# Patient Record
Sex: Female | Born: 1965 | Race: Black or African American | Hispanic: No | Marital: Single | Smoking: Current every day smoker
Health system: Southern US, Community
[De-identification: ages and names within clinical notes are randomized; demographics above are authoritative.]

## PROBLEM LIST (undated history)

## (undated) DIAGNOSIS — E119 Type 2 diabetes mellitus without complications: Secondary | ICD-10-CM

## (undated) HISTORY — PX: ABDOMINAL HYSTERECTOMY: SHX81

---

## 2012-05-06 ENCOUNTER — Encounter (HOSPITAL_BASED_OUTPATIENT_CLINIC_OR_DEPARTMENT_OTHER): Payer: Self-pay | Admitting: *Deleted

## 2012-05-06 ENCOUNTER — Emergency Department (HOSPITAL_BASED_OUTPATIENT_CLINIC_OR_DEPARTMENT_OTHER)
Admission: EM | Admit: 2012-05-06 | Discharge: 2012-05-06 | Disposition: A | Discharge status: Home / Self Care | Payer: Medicaid Other | Attending: Emergency Medicine | Admitting: Emergency Medicine

## 2012-05-06 DIAGNOSIS — F17200 Nicotine dependence, unspecified, uncomplicated: Secondary | ICD-10-CM | POA: Insufficient documentation

## 2012-05-06 DIAGNOSIS — T819XXA Unspecified complication of procedure, initial encounter: Secondary | ICD-10-CM

## 2012-05-06 DIAGNOSIS — IMO0001 Reserved for inherently not codable concepts without codable children: Secondary | ICD-10-CM | POA: Insufficient documentation

## 2012-05-06 DIAGNOSIS — T8131XA Disruption of external operation (surgical) wound, not elsewhere classified, initial encounter: Secondary | ICD-10-CM | POA: Insufficient documentation

## 2012-05-06 DIAGNOSIS — T8189XA Other complications of procedures, not elsewhere classified, initial encounter: Secondary | ICD-10-CM | POA: Insufficient documentation

## 2012-05-06 DIAGNOSIS — Y838 Other surgical procedures as the cause of abnormal reaction of the patient, or of later complication, without mention of misadventure at the time of the procedure: Secondary | ICD-10-CM | POA: Insufficient documentation

## 2012-05-06 HISTORY — DX: Type 2 diabetes mellitus without complications: E11.9

## 2012-05-06 LAB — CBC WITH DIFFERENTIAL/PLATELET
Basophils Absolute: 0 10*3/uL (ref 0.0–0.1)
Basophils Relative: 0 % (ref 0–1)
Eosinophils Absolute: 0 10*3/uL (ref 0.0–0.7)
Eosinophils Relative: 1 % (ref 0–5)
HCT: 33.6 % — ABNORMAL LOW (ref 36.0–46.0)
Hemoglobin: 11.8 g/dL — ABNORMAL LOW (ref 12.0–15.0)
Lymphocytes Relative: 38 % (ref 12–46)
Lymphs Abs: 2.9 10*3/uL (ref 0.7–4.0)
MCH: 27.9 pg (ref 26.0–34.0)
MCHC: 35.1 g/dL (ref 30.0–36.0)
MCV: 79.4 fL (ref 78.0–100.0)
Monocytes Absolute: 0.6 10*3/uL (ref 0.1–1.0)
Monocytes Relative: 7 % (ref 3–12)
Neutro Abs: 4.3 10*3/uL (ref 1.7–7.7)
Neutrophils Relative %: 55 % (ref 43–77)
Platelets: 327 10*3/uL (ref 150–400)
RBC: 4.23 MIL/uL (ref 3.87–5.11)
RDW: 12.7 % (ref 11.5–15.5)
WBC: 7.8 10*3/uL (ref 4.0–10.5)

## 2012-05-06 LAB — BASIC METABOLIC PANEL WITH GFR
BUN: 7 mg/dL (ref 6–23)
CO2: 22 meq/L (ref 19–32)
Calcium: 9.6 mg/dL (ref 8.4–10.5)
Chloride: 99 meq/L (ref 96–112)
Creatinine, Ser: 0.4 mg/dL — ABNORMAL LOW (ref 0.50–1.10)
GFR calc Af Amer: >90 mL/min
GFR calc non Af Amer: >90 mL/min
Glucose, Bld: 243 mg/dL — ABNORMAL HIGH (ref 70–99)
Potassium: 3.6 meq/L (ref 3.5–5.1)
Sodium: 134 meq/L — ABNORMAL LOW (ref 135–145)

## 2012-05-06 MED ORDER — OXYCODONE-ACETAMINOPHEN 5-325 MG PO TABS: 2.0000 | ORAL_TABLET | ORAL | Status: DC | PRN

## 2012-05-06 NOTE — ED Provider Notes (Signed)
History     CSN: [id number]  Arrival date & time 05/06/12  1246   First MD Initiated Contact with Patient 05/06/12 1421      Chief Complaint  Patient presents with  . Wound Infection    (Consider location/radiation/quality/duration/timing/severity/associated sxs/prior treatment) Patient is a 46 y.o. female presenting with hand pain. The history is provided by the patient. No language interpreter was used.  Hand Pain This is a recurrent problem. Episode onset: 10 days. The problem occurs constantly. The problem has been gradually worsening. Pertinent negatives include no chills. Treatments tried: antibiotics. The treatment provided no relief.  Pt complains of pain to her left thumb.   Pt reports she has had 2 I and D's.  Pt upset because she thinks the first was not done right.,  Pt is on antibiotics.  (Pt does not know what medications she is on)  Pt reports she is schedule to be seen at [facility] for a recheck on Thursday.  I obtained records from [facility].  Pt had a basic I and D to abscessed area mid thumb on 11/8.  Pt had worsening swelling and was seen by surgeon who felt area had progressed to a felon and did a lateral incision and opened to medial incision.  Pt presents here today because she reports pain is worseand her concern that something was done wrong.   Pt is diabetic,  Pt's records indicate poorly controlled diabetes.    Past Medical History  Diagnosis Date  . Diabetes mellitus without complication     Past Surgical History  Procedure Date  . Cesarean section   . Abdominal hysterectomy     History reviewed. No pertinent family history.  History  Substance Use Topics  . Smoking status: Current Every Day Smoker -- 0.5 packs/day    Types: Cigarettes  . Smokeless tobacco: Not on file  . Alcohol Use: No    OB History    Grav Para Term Preterm Abortions TAB SAB Ect Mult Living                  Review of Systems  Constitutional: Negative for chills.    All other systems reviewed and are negative.    Allergies  Review of patient's allergies indicates no known allergies.  Home Medications  No current outpatient prescriptions on file.  BP 137/99  Pulse 100  Temp 99.6 F (37.6 C) (Oral)  Ht 4\' 11"  (1.499 m)  Wt 130 lb (58.968 kg)  BMI 26.26 kg/m2  SpO2 100%  Physical Exam  Nursing note and vitals reviewed. Constitutional: She is oriented to person, place, and time. She appears well-developed and well-nourished.  Musculoskeletal: She exhibits edema and tenderness.       Discolored left thumb,  Open incisions,  No drainage,  No erythema  Neurological: She is alert and oriented to person, place, and time. She has normal reflexes.  Psychiatric: She has a normal mood and affect.    ED Course  Procedures (including critical care time)   Labs Reviewed  CBC WITH DIFFERENTIAL  BASIC METABOLIC PANEL   No results found.   No diagnosis found.    MDM  Dr. Ethelda Chick in to see and examine.   I spoke to [facility] office.  They will see the pt in the office tomorrow.  I advised pt her care appears appropriate. I suspect Pt is healing poorly due to uncontrolled diabetes.   She needs to continue her antibiotic (Bactrim by  her records)   Pt is to go to [facility] tomorrow at 1:40pm.   Pt is counseled on controlling diabetes.          Brianna Lin, [place] 05/06/12 [phone]

## 2012-05-06 NOTE — ED Notes (Signed)
[facility] st[facility]" and does not know meds but antibiotics. Requesting recent medical records from [facility] ED.

## 2012-05-06 NOTE — ED Provider Notes (Addendum)
Complain of left swollen painful thumb since 04/26/2012 patient recently had felon incised and drained at [facility]. Has follow up at the hand specialist office scheduled for 05/09/2012 on exam patient is alert appears uncomfortable left thumb swollen tender diffusely. Limited flexion due to pain and swelling. Followup appointment with hand surgeon has been rescheduled for tomorrow.  Doug Sou, MD 05/06/12 1637  Doug Sou, MD 05/06/12 [phone]

## 2012-05-06 NOTE — ED Notes (Addendum)
Pt c/o wound to left thumb with recent admission to [place] , drainage recently removed Thursday , pt states thumb is not better, unknown ABX pt is taking

## 2012-05-06 NOTE — ED Provider Notes (Signed)
Medical screening examination/treatment/procedure(s) were conducted as a shared visit with non-physician practitioner(s) and myself.  I personally evaluated the patient during the encounter  Doug Sou, MD 05/06/12 [phone]

## 2012-08-26 ENCOUNTER — Encounter (HOSPITAL_BASED_OUTPATIENT_CLINIC_OR_DEPARTMENT_OTHER): Payer: Self-pay | Admitting: Family Medicine

## 2012-08-26 ENCOUNTER — Emergency Department (HOSPITAL_BASED_OUTPATIENT_CLINIC_OR_DEPARTMENT_OTHER)
Admission: EM | Admit: 2012-08-26 | Discharge: 2012-08-26 | Disposition: A | Discharge status: Home / Self Care | Payer: Medicaid Other | Attending: Emergency Medicine | Admitting: Emergency Medicine

## 2012-08-26 ENCOUNTER — Emergency Department (HOSPITAL_BASED_OUTPATIENT_CLINIC_OR_DEPARTMENT_OTHER): Payer: Medicaid Other

## 2012-08-26 DIAGNOSIS — IMO0002 Reserved for concepts with insufficient information to code with codable children: Secondary | ICD-10-CM | POA: Insufficient documentation

## 2012-08-26 DIAGNOSIS — Z79899 Other long term (current) drug therapy: Secondary | ICD-10-CM | POA: Insufficient documentation

## 2012-08-26 DIAGNOSIS — W19XXXA Unspecified fall, initial encounter: Secondary | ICD-10-CM

## 2012-08-26 DIAGNOSIS — W1809XA Striking against other object with subsequent fall, initial encounter: Secondary | ICD-10-CM | POA: Insufficient documentation

## 2012-08-26 DIAGNOSIS — Y939 Activity, unspecified: Secondary | ICD-10-CM | POA: Insufficient documentation

## 2012-08-26 DIAGNOSIS — Z794 Long term (current) use of insulin: Secondary | ICD-10-CM | POA: Insufficient documentation

## 2012-08-26 DIAGNOSIS — E119 Type 2 diabetes mellitus without complications: Secondary | ICD-10-CM | POA: Insufficient documentation

## 2012-08-26 DIAGNOSIS — Y929 Unspecified place or not applicable: Secondary | ICD-10-CM | POA: Insufficient documentation

## 2012-08-26 DIAGNOSIS — F17200 Nicotine dependence, unspecified, uncomplicated: Secondary | ICD-10-CM | POA: Insufficient documentation

## 2012-08-26 DIAGNOSIS — M549 Dorsalgia, unspecified: Secondary | ICD-10-CM

## 2012-08-26 MED ORDER — HYDROCODONE-ACETAMINOPHEN 5-325 MG PO TABS: 1.0000 | ORAL_TABLET | Freq: Four times a day (QID) | ORAL | Status: DC | PRN

## 2012-08-26 MED ORDER — HYDROCODONE-ACETAMINOPHEN 5-325 MG PO TABS
2.0000 | ORAL_TABLET | Freq: Once | ORAL | Status: AC
  Administered 2012-08-26: 2 via ORAL
  Filled 2012-08-26: qty 2

## 2012-08-26 NOTE — ED Notes (Signed)
Patient transported to X-ray 

## 2012-08-26 NOTE — ED Provider Notes (Signed)
History     CSN: [id number]  Arrival date & time 08/26/12  0904   First MD Initiated Contact with Patient 08/26/12 [phone]      Chief Complaint  Patient presents with  . Back Pain  . Fall    (Consider location/radiation/quality/duration/timing/severity/associated sxs/prior treatment) Patient is a 47 y.o. female presenting with back pain and fall. The history is provided by the patient.  Back Pain Associated symptoms: no abdominal pain, no chest pain, no fever, no headaches, no numbness and no weakness   Fall Pertinent negatives include no fever, no numbness, no abdominal pain, no nausea, no vomiting and no headaches.  pt states fell getting out of bunk at shelter, fell back. C/o upper back pain. Constant, dull, moderate, worse w palpation. No numbness/weakness. Hit head, denies loc. No headache. No neck or lower back pain. No nv. Ambulatory since fall. Denies other injury.    Past Medical History  Diagnosis Date  . Diabetes mellitus without complication     Past Surgical History  Procedure Laterality Date  . Cesarean section    . Abdominal hysterectomy      No family history on file.  History  Substance Use Topics  . Smoking status: Current Every Day Smoker -- 0.50 packs/day    Types: Cigarettes  . Smokeless tobacco: Not on file  . Alcohol Use: No    OB History   Grav Para Term Preterm Abortions TAB SAB Ect Mult Living                  Review of Systems  Constitutional: Negative for fever and chills.  HENT: Negative for neck pain.   Eyes: Negative for pain and visual disturbance.  Respiratory: Negative for shortness of breath.   Cardiovascular: Negative for chest pain.  Gastrointestinal: Negative for nausea, vomiting and abdominal pain.  Genitourinary: Negative for flank pain.  Musculoskeletal: Positive for back pain.  Skin: Negative for wound.  Neurological: Negative for weakness, numbness and headaches.  Hematological: Does not bruise/bleed easily.   Psychiatric/Behavioral: Negative for confusion.    Allergies  Review of patient's allergies indicates no known allergies.  Home Medications   Current Outpatient Rx  Name  Route  Sig  Dispense  Refill  . insulin aspart (NOVOLOG) 100 UNIT/ML injection   Subcutaneous   Inject into the skin 3 (three) times daily before meals.         Marland Kitchen HYDROCODONE-ACETAMINOPHEN PO   Oral   Take by mouth.         . insulin glargine (LANTUS) 100 UNIT/ML injection   Subcutaneous   Inject into the skin at bedtime.         Marland Kitchen oxyCODONE-acetaminophen (PERCOCET/ROXICET) 5-325 MG per tablet   Oral   Take 2 tablets by mouth every 4 (four) hours as needed for pain.   10 tablet   0   . Sulfamethoxazole-Trimethoprim (BACTRIM PO)   Oral   Take by mouth.           BP 134/97  Pulse 102  Temp(Src) 98.3 F (36.8 C) (Oral)  Resp 20  Ht 4\' 11"  (1.499 m)  Wt 133 lb (60.328 kg)  BMI 26.85 kg/m2  SpO2 97%  Physical Exam  Nursing note and vitals reviewed. Constitutional: She is oriented to person, place, and time. She appears well-developed and well-nourished. No distress.  HENT:  Head: Atraumatic.  Nose: Nose normal.  Mouth/Throat: Oropharynx is clear and moist.  Eyes: Conjunctivae are normal. Pupils are equal, round, and  reactive to light.  Neck: Neck supple. No tracheal deviation present.  Cardiovascular: Normal rate, regular rhythm, normal heart sounds and intact distal pulses.   Pulmonary/Chest: Effort normal. No respiratory distress. She exhibits no tenderness.  Abdominal: Soft. Normal appearance. She exhibits no distension. There is no tenderness.  Genitourinary:  No cva  tenderness  Musculoskeletal: Normal range of motion. She exhibits no edema and no tenderness.  Lower neck and upper back tenderness, otherwise CTLS spine, non tender, aligned, no step off.   Neurological: She is alert and oriented to person, place, and time.  Motor intact bil. Steady gait.   Skin: Skin is warm and  dry. No rash noted.  Psychiatric: She has a normal mood and affect.    ED Course  Procedures (including critical care time)   Dg Cervical Spine Complete  08/26/2012  *RADIOLOGY REPORT*  Clinical Data: Pain post trauma  CERVICAL SPINE - COMPLETE 4+ VIEW  Comparison: None.  Findings: Frontal, lateral, open-mouth odontoid, bilateral oblique views were obtained.  There is no fracture or spondylolisthesis. Prevertebral soft tissues and predental space regions are normal.  Disc spaces appear intact except for moderate narrowing at C7-T1. There is a prominent anterior osteophyte at C7.  There is no appreciable exit foraminal narrowing on the oblique views.  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  No fracture or spondylolisthesis.   Original Report Authenticated By: Bretta Bang, M.D.    Dg Thoracic Spine 2 View  08/26/2012  *RADIOLOGY REPORT*  Clinical Data: Pain post trauma  THORACIC SPINE - 3 VIEW  Comparison: None.  Findings: Frontal, lateral, and swimmer's views were obtained. There is no fracture or spondylolisthesis.  There is disc space narrowing at multiple levels consistent with osteoarthritic change. No erosive change.  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  No fracture or spondylolisthesis.   Original Report Authenticated By: Bretta Bang, M.D.       MDM  No meds pta. Confirmed nkda.  vicodin po.  Xrays.  Reviewed nursing notes and prior charts for additional history.   Recheck pt comfortable. Spine without focal midline bony tenderness.  Discussed xr w pt.   Stable for d.c         Suzi Roots, MD 08/26/12 1120

## 2012-08-26 NOTE — ED Notes (Addendum)
Pt c/o back pain and sts she fell off top bunk at shelter this morning. Pt sts she "slipped upon awakening".  Pt sts she hit back of head and back on floor. Pt reports "seeing spots after fall" but has resolved. No loc.

## 2014-01-01 ENCOUNTER — Ambulatory Visit: Payer: Medicaid Other | Admitting: Occupational Therapy

## 2014-01-06 ENCOUNTER — Ambulatory Visit: Payer: Medicaid Other | Attending: Orthopaedic Surgery | Admitting: Occupational Therapy

## 2014-01-06 DIAGNOSIS — Z9889 Other specified postprocedural states: Secondary | ICD-10-CM | POA: Insufficient documentation

## 2014-01-06 DIAGNOSIS — M25649 Stiffness of unspecified hand, not elsewhere classified: Secondary | ICD-10-CM | POA: Insufficient documentation

## 2014-01-06 DIAGNOSIS — M25549 Pain in joints of unspecified hand: Secondary | ICD-10-CM | POA: Insufficient documentation

## 2014-01-06 DIAGNOSIS — M6281 Muscle weakness (generalized): Secondary | ICD-10-CM | POA: Insufficient documentation

## 2014-01-06 DIAGNOSIS — IMO0001 Reserved for inherently not codable concepts without codable children: Secondary | ICD-10-CM | POA: Insufficient documentation

## 2014-01-21 ENCOUNTER — Ambulatory Visit: Payer: Medicaid Other | Attending: Orthopaedic Surgery | Admitting: Occupational Therapy

## 2014-01-21 DIAGNOSIS — M25649 Stiffness of unspecified hand, not elsewhere classified: Secondary | ICD-10-CM | POA: Insufficient documentation

## 2014-01-21 DIAGNOSIS — IMO0001 Reserved for inherently not codable concepts without codable children: Secondary | ICD-10-CM | POA: Insufficient documentation

## 2014-01-21 DIAGNOSIS — Z9889 Other specified postprocedural states: Secondary | ICD-10-CM | POA: Insufficient documentation

## 2014-01-21 DIAGNOSIS — M25549 Pain in joints of unspecified hand: Secondary | ICD-10-CM | POA: Insufficient documentation

## 2014-01-21 DIAGNOSIS — M6281 Muscle weakness (generalized): Secondary | ICD-10-CM | POA: Insufficient documentation

## 2014-01-27 ENCOUNTER — Ambulatory Visit: Payer: Medicaid Other | Admitting: Occupational Therapy

## 2014-01-27 DIAGNOSIS — IMO0001 Reserved for inherently not codable concepts without codable children: Secondary | ICD-10-CM | POA: Diagnosis not present

## 2014-01-27 DIAGNOSIS — M25649 Stiffness of unspecified hand, not elsewhere classified: Secondary | ICD-10-CM | POA: Diagnosis not present

## 2014-01-27 DIAGNOSIS — Z9889 Other specified postprocedural states: Secondary | ICD-10-CM | POA: Diagnosis not present

## 2014-01-27 DIAGNOSIS — M6281 Muscle weakness (generalized): Secondary | ICD-10-CM | POA: Diagnosis not present

## 2014-01-27 DIAGNOSIS — M25549 Pain in joints of unspecified hand: Secondary | ICD-10-CM | POA: Diagnosis not present

## 2014-02-04 ENCOUNTER — Encounter: Payer: Medicaid Other | Admitting: Occupational Therapy

## 2014-02-06 ENCOUNTER — Ambulatory Visit: Payer: Medicaid Other | Admitting: Occupational Therapy

## 2014-02-10 ENCOUNTER — Ambulatory Visit: Payer: Medicaid Other | Admitting: Occupational Therapy

## 2014-02-12 ENCOUNTER — Ambulatory Visit: Payer: Medicaid Other | Admitting: Occupational Therapy

## 2014-02-17 ENCOUNTER — Encounter: Payer: Medicaid Other | Admitting: Occupational Therapy

## 2014-02-25 ENCOUNTER — Encounter: Payer: Medicaid Other | Admitting: Occupational Therapy

## 2014-03-03 ENCOUNTER — Ambulatory Visit: Payer: Medicaid Other | Attending: Orthopaedic Surgery | Admitting: Occupational Therapy

## 2014-03-03 DIAGNOSIS — M6281 Muscle weakness (generalized): Secondary | ICD-10-CM | POA: Diagnosis not present

## 2014-03-03 DIAGNOSIS — IMO0001 Reserved for inherently not codable concepts without codable children: Secondary | ICD-10-CM | POA: Diagnosis not present

## 2014-03-03 DIAGNOSIS — Z9889 Other specified postprocedural states: Secondary | ICD-10-CM | POA: Insufficient documentation

## 2014-03-03 DIAGNOSIS — M25649 Stiffness of unspecified hand, not elsewhere classified: Secondary | ICD-10-CM | POA: Diagnosis not present

## 2014-03-03 DIAGNOSIS — M25549 Pain in joints of unspecified hand: Secondary | ICD-10-CM | POA: Diagnosis not present

## 2014-09-02 ENCOUNTER — Encounter: Payer: Self-pay | Admitting: Physical Medicine & Rehabilitation

## 2014-09-18 IMAGING — CR DG CERVICAL SPINE COMPLETE 4+V
6 series · 6 of 6 positions shown · non-contrast
Comparison: None.

CLINICAL DATA: Pain post trauma

CERVICAL SPINE - COMPLETE 4+ VIEW

[w c-spine lat]
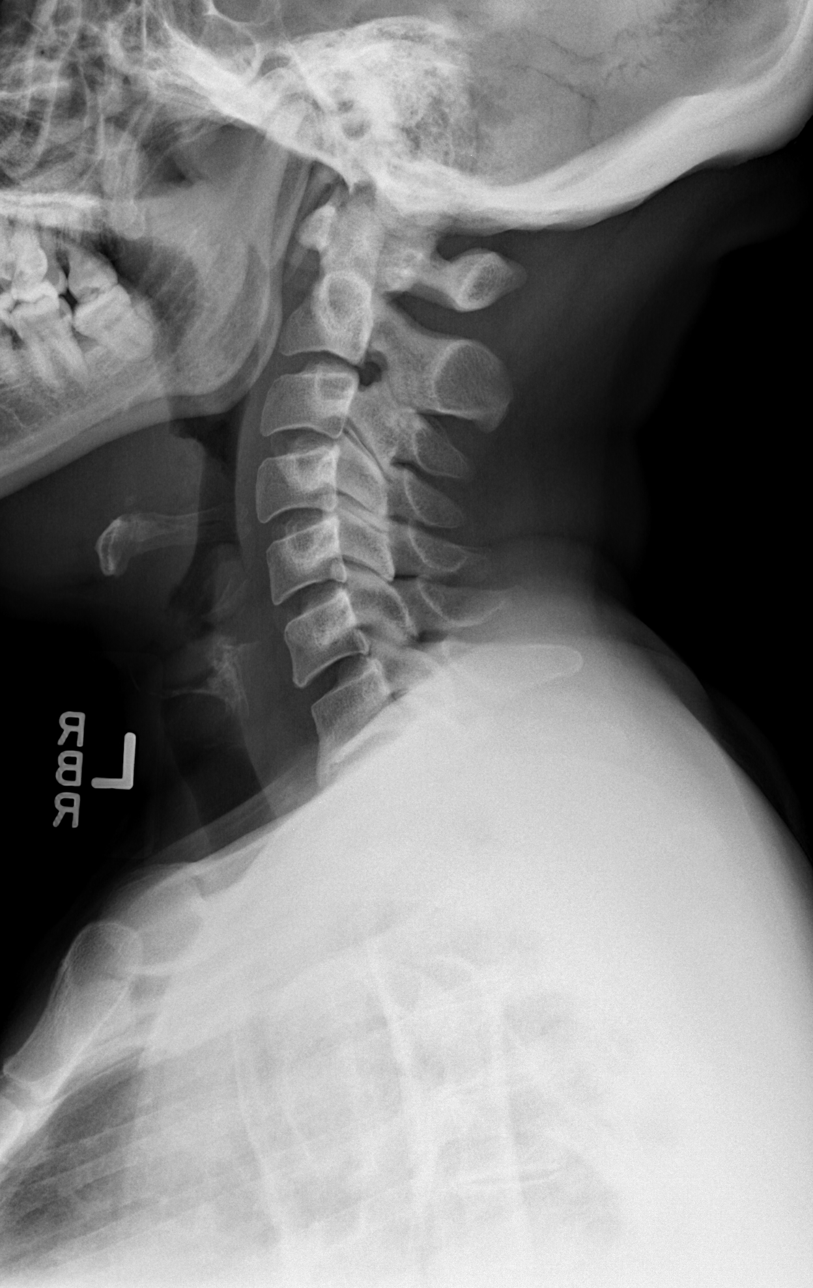

[w c-spine oblique (1 of 2)]
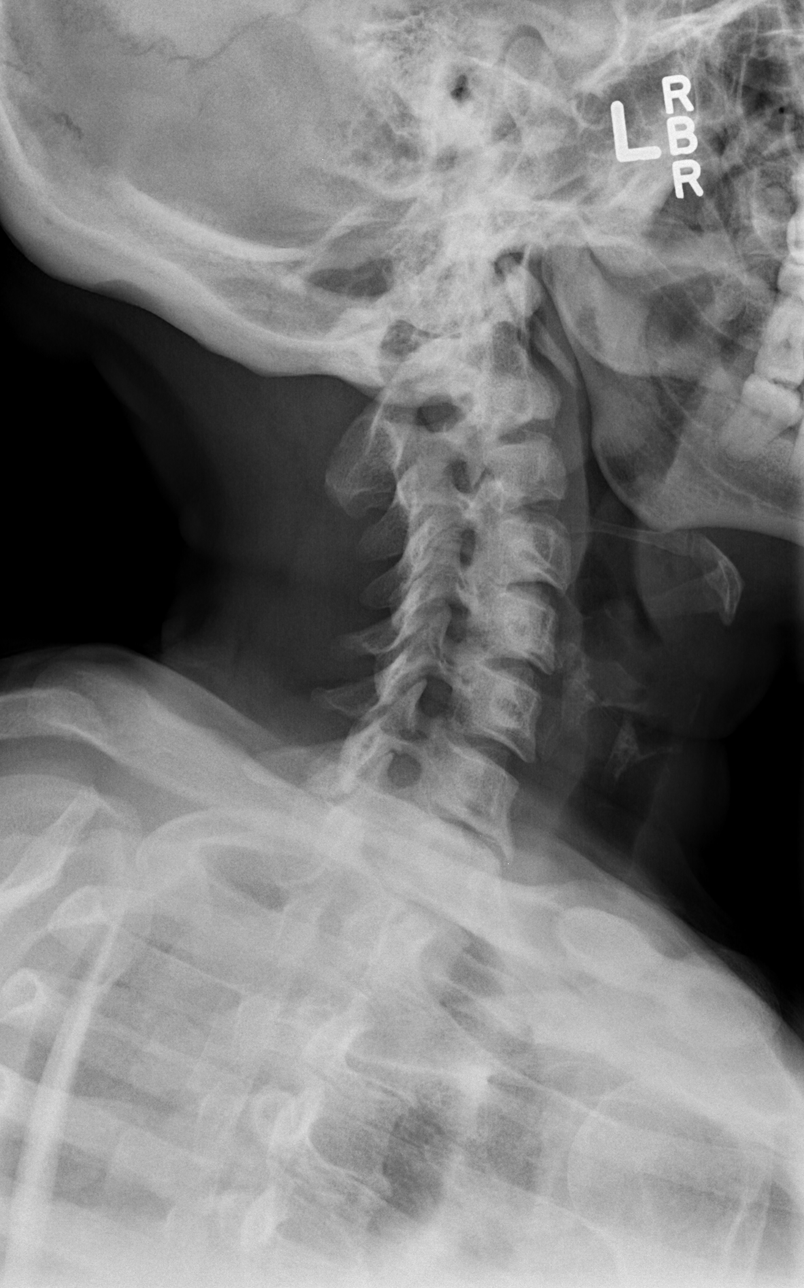

[w c-spine oblique (2 of 2)]
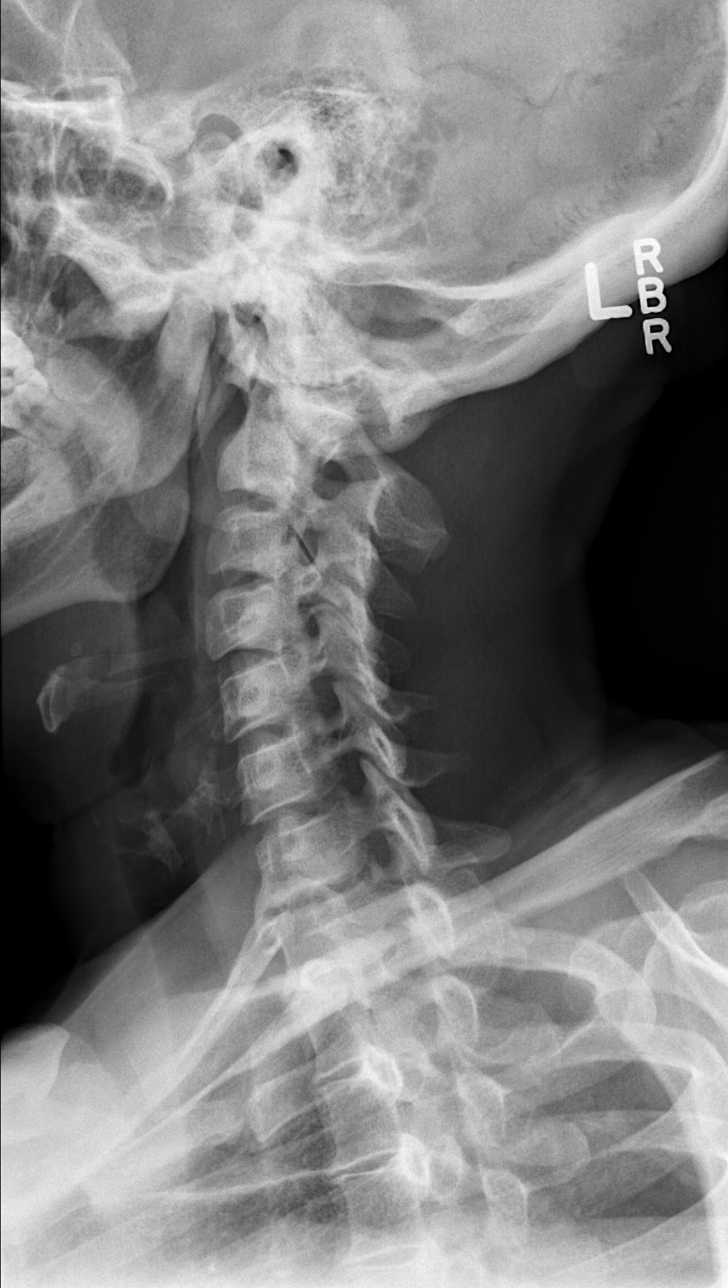

[w c-spine a.p.]
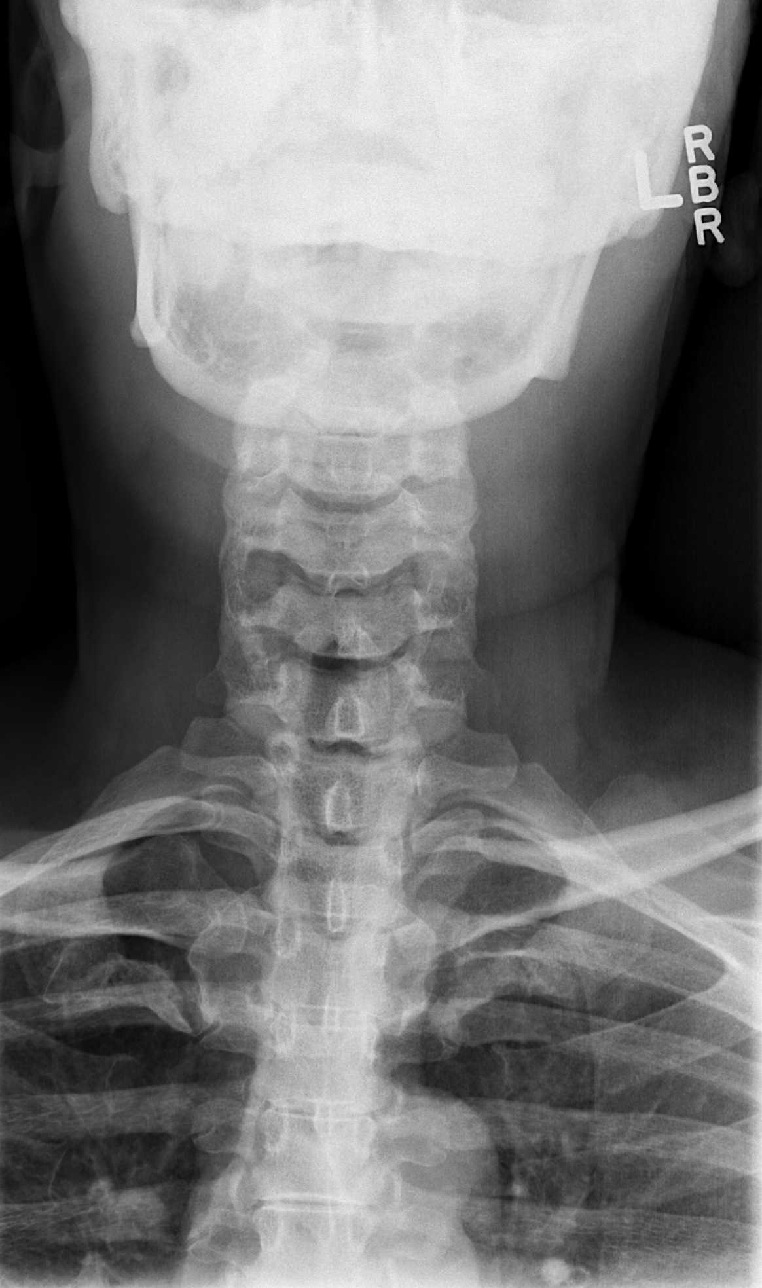

[w c-spine odontoid * (1 of 2)]
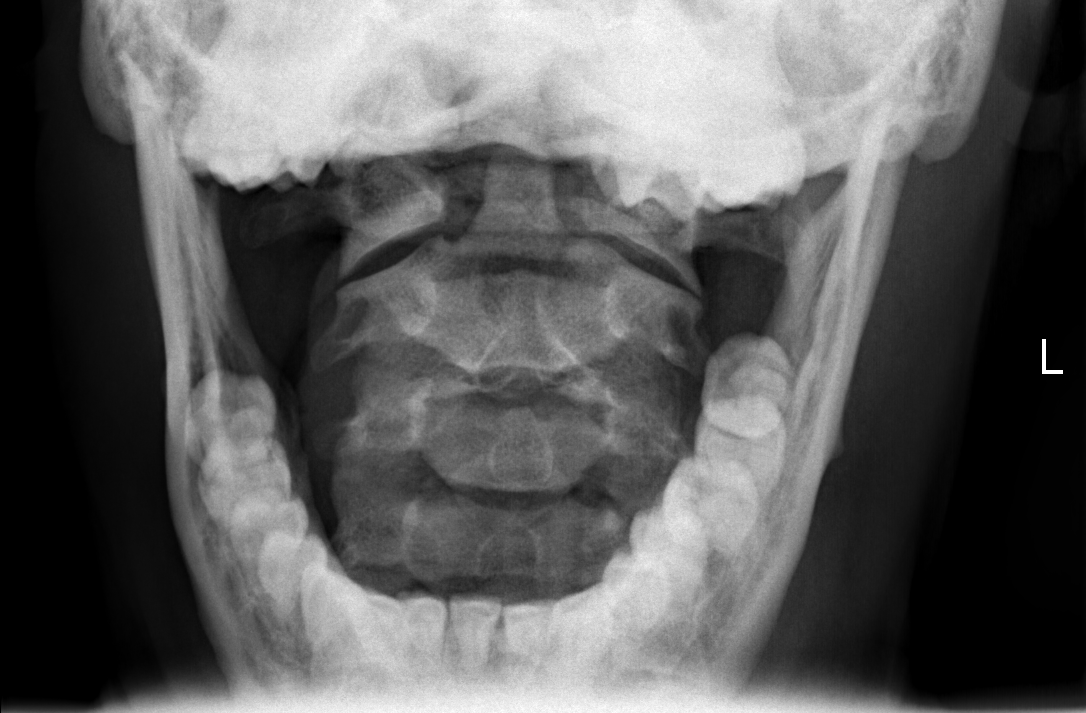

[w c-spine odontoid * (2 of 2)]
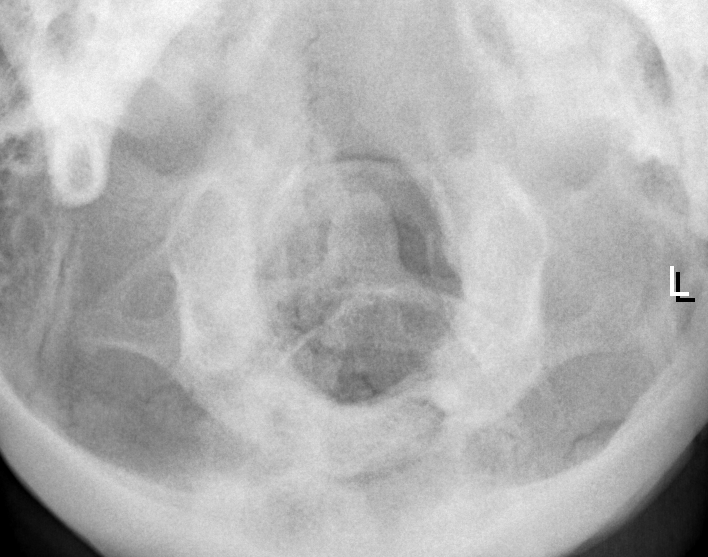

[6 of 6 positions shown; findings below may reference images not displayed]

FINDINGS: Frontal, lateral, open-mouth odontoid, bilateral oblique
views were obtained.  There is no fracture or spondylolisthesis.
Prevertebral soft tissues and predental space regions are normal.

Disc spaces appear intact except for moderate narrowing at C7-T1.
There is a prominent anterior osteophyte at C7.  There is no
appreciable exit foraminal narrowing on the oblique views.
IMPRESSION: Osteoarthritic change at C7- T1.  No fracture or spondylolisthesis.

## 2014-09-18 IMAGING — CR DG THORACIC SPINE 2V
4 series · 4 of 4 positions shown · non-contrast
Comparison: None.

CLINICAL DATA: Pain post trauma

THORACIC SPINE - 3 VIEW

[w t-spine a.p. *]
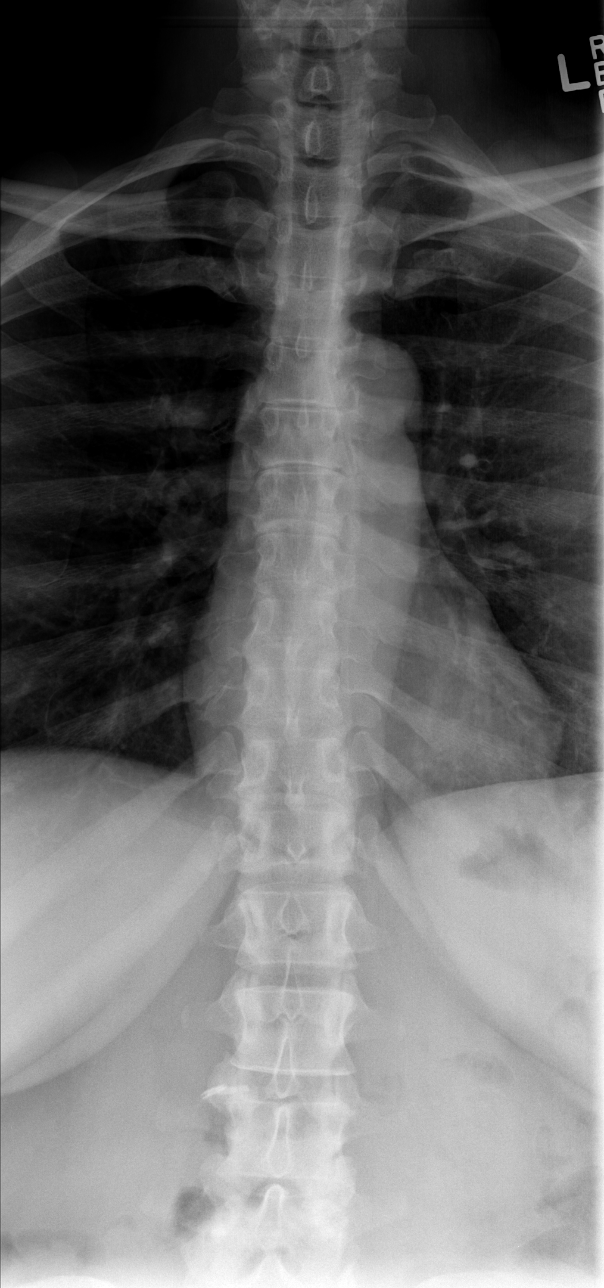

[w t-spine lat *]
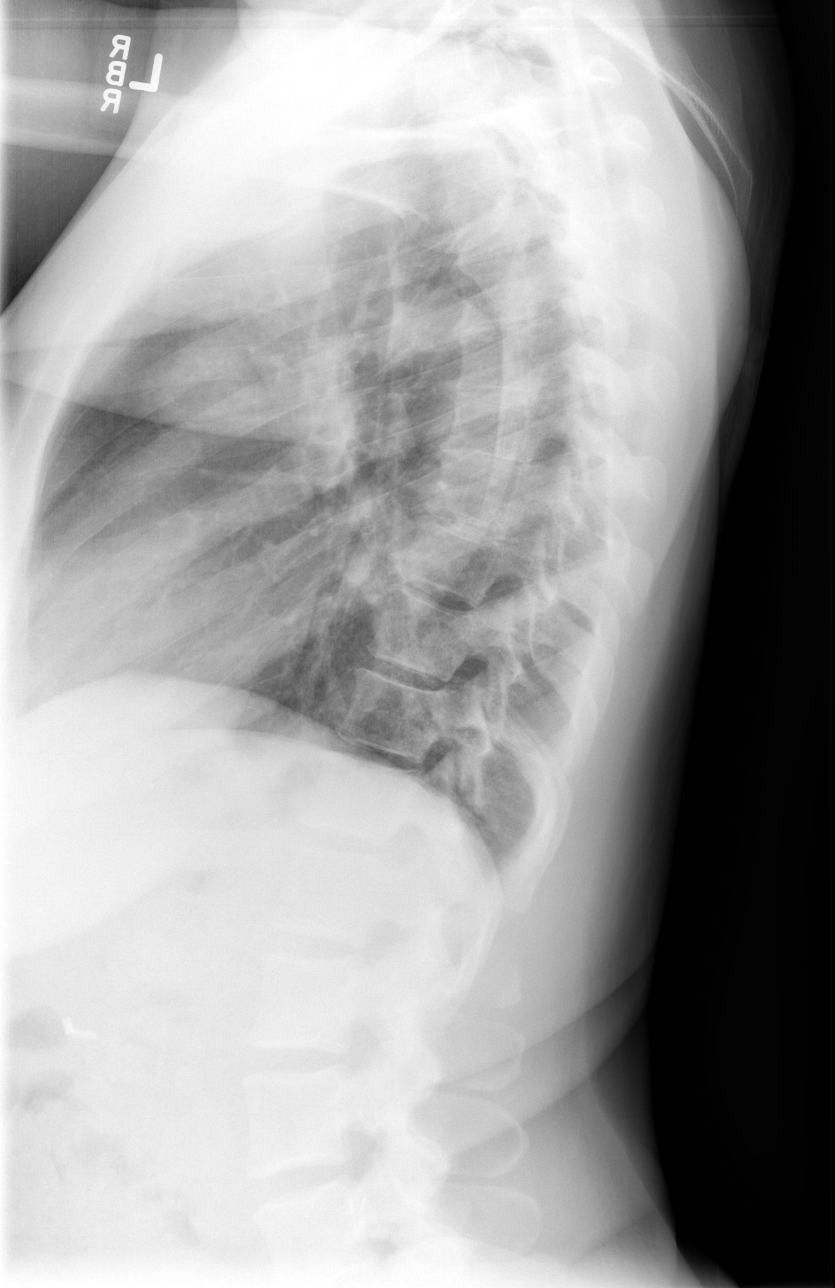

[w swimmers view]
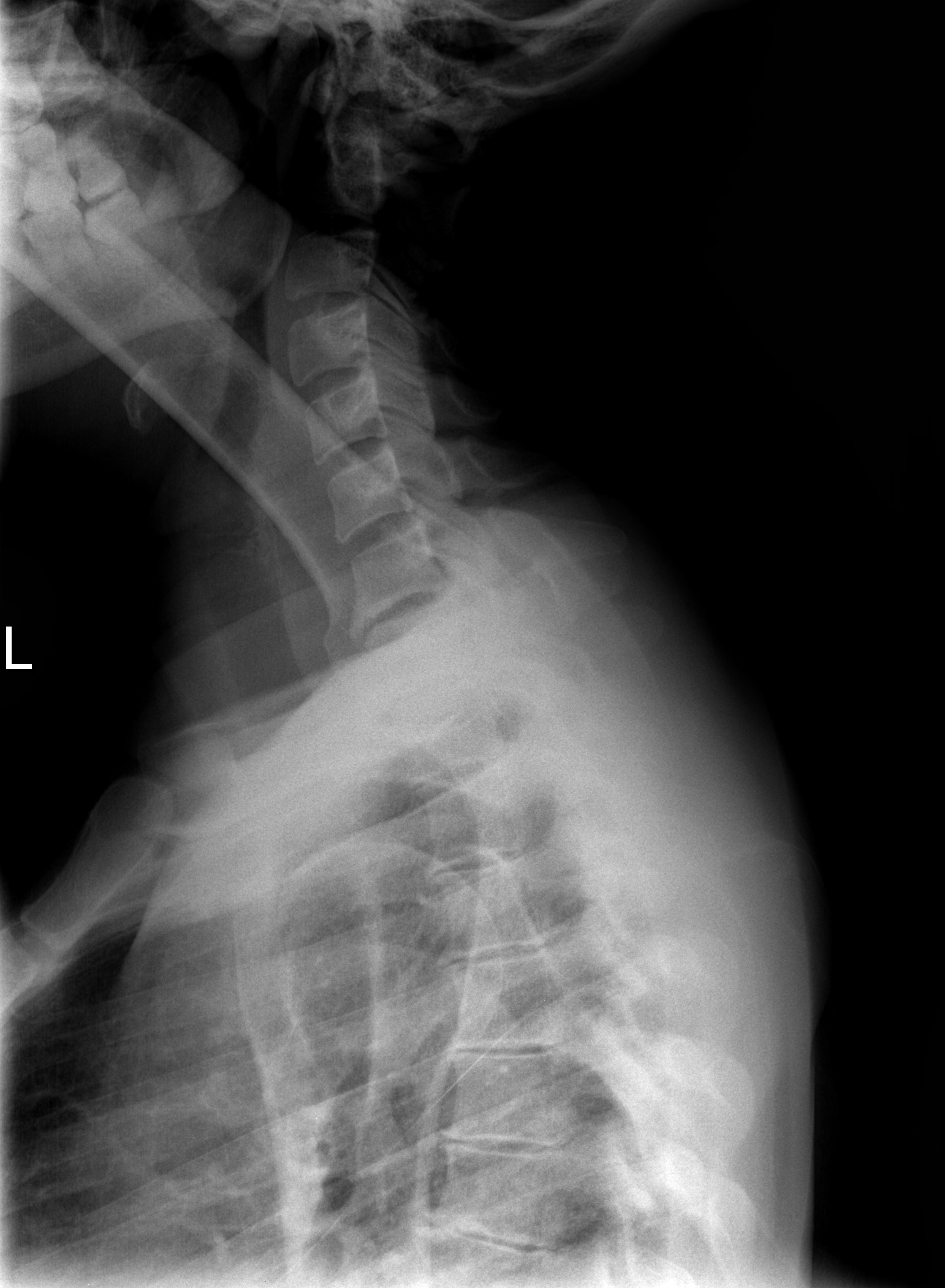

[w swimmers view *]
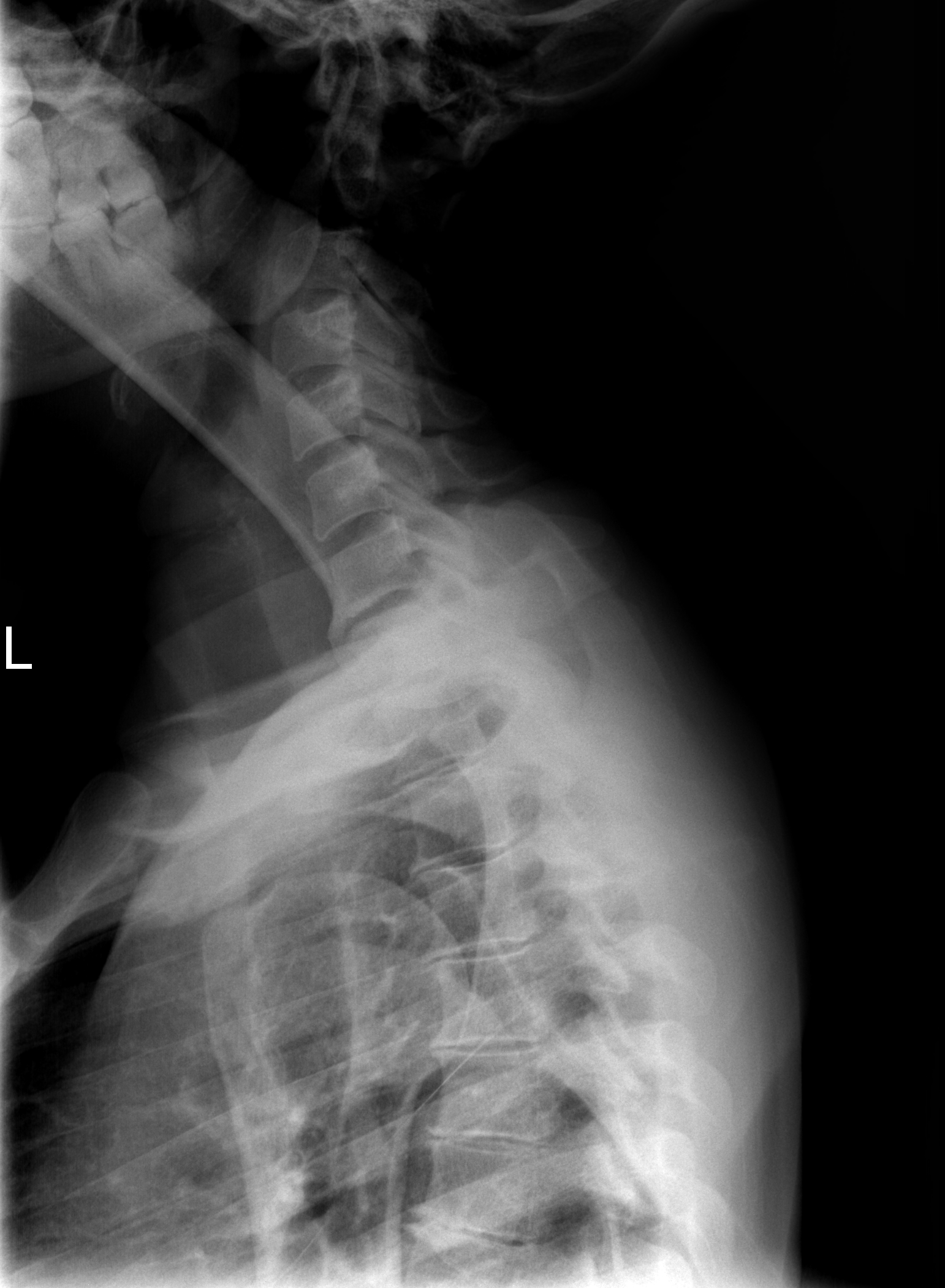

[4 of 4 positions shown; findings below may reference images not displayed]

FINDINGS: Frontal, lateral, and swimmer's views were obtained.
There is no fracture or spondylolisthesis.  There is disc space
narrowing at multiple levels consistent with osteoarthritic change.
No erosive change.
IMPRESSION: Osteoarthritic change at multiple levels.  No fracture or
spondylolisthesis.

## 2014-10-20 ENCOUNTER — Encounter: Payer: Medicaid Other | Attending: Physical Medicine & Rehabilitation | Admitting: Physical Medicine & Rehabilitation

## 2014-10-20 ENCOUNTER — Other Ambulatory Visit: Payer: Self-pay | Admitting: Physical Medicine & Rehabilitation

## 2014-10-20 ENCOUNTER — Encounter: Payer: Self-pay | Admitting: Physical Medicine & Rehabilitation

## 2014-10-20 VITALS — BP 127/81 | HR 87 | Resp 14

## 2014-10-20 DIAGNOSIS — Z79899 Other long term (current) drug therapy: Secondary | ICD-10-CM | POA: Diagnosis not present

## 2014-10-20 DIAGNOSIS — G894 Chronic pain syndrome: Secondary | ICD-10-CM

## 2014-10-20 DIAGNOSIS — G5601 Carpal tunnel syndrome, right upper limb: Secondary | ICD-10-CM | POA: Diagnosis not present

## 2014-10-20 DIAGNOSIS — M79641 Pain in right hand: Secondary | ICD-10-CM | POA: Diagnosis not present

## 2014-10-20 DIAGNOSIS — E1140 Type 2 diabetes mellitus with diabetic neuropathy, unspecified: Secondary | ICD-10-CM | POA: Diagnosis not present

## 2014-10-20 DIAGNOSIS — F17210 Nicotine dependence, cigarettes, uncomplicated: Secondary | ICD-10-CM | POA: Insufficient documentation

## 2014-10-20 DIAGNOSIS — G5641 Causalgia of right upper limb: Secondary | ICD-10-CM | POA: Diagnosis not present

## 2014-10-20 DIAGNOSIS — M79674 Pain in right toe(s): Secondary | ICD-10-CM | POA: Diagnosis not present

## 2014-10-20 DIAGNOSIS — Z5181 Encounter for therapeutic drug level monitoring: Secondary | ICD-10-CM | POA: Diagnosis not present

## 2014-10-20 DIAGNOSIS — IMO0002 Reserved for concepts with insufficient information to code with codable children: Secondary | ICD-10-CM | POA: Insufficient documentation

## 2014-10-20 DIAGNOSIS — E1165 Type 2 diabetes mellitus with hyperglycemia: Secondary | ICD-10-CM | POA: Insufficient documentation

## 2014-10-20 MED ORDER — DULOXETINE HCL 30 MG PO CPEP: 30.0000 mg | ORAL_CAPSULE | Freq: Every day | ORAL | Status: AC

## 2014-10-20 NOTE — Patient Instructions (Addendum)
ONCE I HAVE CONFIRMATION THAT YOUR URINE SPECIMEN IS CONSISTENT WITH YOUR HISTORY AND PRESCRIBED MEDICATIONS, I WILL BE WILLING TO PRESCRIBE YOUR PAIN MEDICATION. THE RESULTS OF YOUR URINE TESTING COULD TAKE A WEEK OR MORE TO RETURN, HOWEVER.  IF WE DO NOT CONTACT YOU REGARDING THESE RESULTS WITHIN 10 DAYS, PLEASE CONTACT US.    STOP SMOKING!!!!

## 2014-10-20 NOTE — Progress Notes (Signed)
Subjective:    Patient ID: Brianna Lin, female    DOB: Apr 03, 1966, 49 y.o.   MRN: [id number]  HPI   This is an initial evaluation today for [name] as it pertains to right who has had persistent right hand pain since a carpal tunnel release on 11/20/13. She was referred here by Dr. Oneita Kras who performed stellate ganglion blocks which helped a bit initially. Previous to this she has undergone an appropriate trial of physical therapy, numerous anticonvulsants, narcotics, etc. These include voltaren gel, topamax, lyrica, elavil, gabapentin, percocet 7.5mg . The percocet seems to help more than anything else. She is taking there percocet 3 x daily. Occasionally some rubbing helps with the pain. Heat and ice sometimes. The pattern of her pain seems to vary and she can't pinpoint a time when it's the worst.  Her sleep is inconsistent due to pain. If her hand doesn't act up, she sleeps very well. She used trazodone at one point for sleep.      Dr. Yevette Edwards is seeing the patient for her neck/back. She has an MRI scheduled next week.        Pain Inventory Average Pain 10 Pain Right Now 10 My pain is sharp, stabbing and aching  In the last 24 hours, has pain interfered with the following? General activity 0 Relation with others 0 Enjoyment of life 0 What TIME of day is your pain at its worst? varies Sleep (in general) Poor  Pain is worse with: walking, bending and standing Pain improves with: medication Relief from Meds: 9  Mobility walk without assistance do you drive?  no  Function Do you have any goals in this area?  no  Neuro/Psych weakness numbness trouble walking  Prior Studies Any changes since last visit?  no  Physicians involved in your care Any changes since last visit?  yes Orthopedist [facility] Referral source Oneita Kras   History reviewed. No pertinent family history. History   Social History  . Marital Status: Single    Spouse Name:  N/A  . Number of Children: N/A  . Years of Education: N/A   Social History Main Topics  . Smoking status: Current Every Day Smoker -- 0.50 packs/day    Types: Cigarettes  . Smokeless tobacco: Not on file  . Alcohol Use: No  . Drug Use: No  . Sexual Activity: Yes    Birth Control/ Protection: Surgical   Other Topics Concern  . None   Social History Narrative   Past Surgical History  Procedure Laterality Date  . Cesarean section    . Abdominal hysterectomy     Past Medical History  Diagnosis Date  . Diabetes mellitus without complication    BP 127/81 mmHg  Pulse 87  Resp 14  SpO2 100%  Opioid Risk Score: 0 Fall Risk Score: High Fall Risk (>13 points) (educated and given handout on fall prevention in the home today)`1  Depression screen PHQ 2/9  Depression screen PHQ 2/9 10/20/2014  Decreased Interest 3  Down, Depressed, Hopeless 1  PHQ - 2 Score 4  Altered sleeping 2  Tired, decreased energy 2  Change in appetite 0  Feeling bad or failure about yourself  1  Trouble concentrating 1  Moving slowly or fidgety/restless 1  Suicidal thoughts 0  PHQ-9 Score 11    Review of Systems  Constitutional: Positive for unexpected weight change.  Musculoskeletal: Positive for gait problem.  Neurological: Positive for weakness and numbness.  All other systems reviewed and are  negative.      Objective:   Physical Exam   General: Alert and oriented x 3, No apparent distress HEENT: Head is normocephalic, atraumatic, PERRLA, EOMI, sclera anicteric, oral mucosa pink and moist, dentition intact, ext ear canals clear,  Neck: Supple without JVD or lymphadenopathy Heart: Reg rate and rhythm. No murmurs rubs or gallops Chest: CTA bilaterally without wheezes, rales, or rhonchi; no distress Abdomen: Soft, non-tender, non-distended, bowel sounds positive. Extremities: No clubbing, cyanosis, or edema. Pulses are 2+ Skin: Clean and intact without signs of breakdown Neuro: Pt is  cognitively appropriate with normal insight, memory, and awareness. Cranial nerves 2-12 are intact. Sensory exam appears to be generally normal except at the right dorsal and volar aspects of the right hand/wrist where it was 1/2. She also has sensory diminishment along the right foot--inconsistent though. Reflexes are 2+ in all 4's. Fine motor coordination is intact. No tremors. Motor function is grossly 5/5 except at affected right hand and foot which were limited by pain..  Musculoskeletal: left great toe was tender with minimal pressure. She was antalgic with weight bearing. No obvious swelling, warmth was noted in the foot. Right wrist was tender to touch with tightness in wrist flexors noted. Right hand and wrist were hypersenstivie to touch but only the wrist was close to being allodynic. No edema or color changes were seen. She did have difficulty with finger to finger touching and grasp. She could extend the five fingers to a "five." wrist flexion was limite also due to pain as we extension. No limitations were seen in the right elbow or shoulder.did not assess her neck or low back in detail. Psych: Pt's affect is a little flat but appropriate. Pt is cooperative           1. Right wrist pain after Carpal Tunnel Release. ?CRPS II? 2. Right foot pain, great toe pain.  3. 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 Tobacco use    Plan: 1. Will begin a trial of cymbalta for neuropathic pain.  2. A UDS was collected today. Pain medication will potentially be rx'ed pending results of testing.  3. Smoking cessation was discussed and educational info was provided.  4. Thirty minutes of face to face patient care time were spent during this visit. All questions were encouraged and answered. Follow up in one month with me or NP.

## 2014-10-21 LAB — PMP ALCOHOL METABOLITE (ETG): ETGU: NEGATIVE ng/mL

## 2014-10-22 LAB — PRESCRIPTION MONITORING PROFILE (SOLSTAS)
AMPHETAMINE/METH: NEGATIVE ng/mL
BARBITURATE SCREEN, URINE: NEGATIVE ng/mL
BUPRENORPHINE, URINE: NEGATIVE ng/mL
Benzodiazepine Screen, Urine: NEGATIVE ng/mL
CANNABINOID SCRN UR: NEGATIVE ng/mL
COCAINE METABOLITES: NEGATIVE ng/mL
CREATININE, URINE: 70.7 mg/dL (ref >20.0)
Carisoprodol, Urine: NEGATIVE ng/mL
FENTANYL URINE: NEGATIVE ng/mL
MDMA URINE: NEGATIVE ng/mL
MEPERIDINE UR: NEGATIVE ng/mL
METHADONE SCREEN, URINE: NEGATIVE ng/mL
Nitrites, Initial: NEGATIVE ug/mL
OPIATE SCREEN, URINE: NEGATIVE ng/mL
OXYCODONE SCRN UR: NEGATIVE ng/mL
Propoxyphene: NEGATIVE ng/mL
TAPENTADOLUR: NEGATIVE ng/mL
Tramadol Scrn, Ur: NEGATIVE ng/mL
Zolpidem, Urine: NEGATIVE ng/mL
pH, Initial: 5.5 pH (ref 4.5–8.9)

## 2014-10-30 ENCOUNTER — Telehealth: Payer: Self-pay | Admitting: *Deleted

## 2014-10-30 NOTE — Telephone Encounter (Signed)
Patient called and asked for UDS results.  Also just had an MRI and she stated that the xray shows that she has a disk out in her neck. She will have the Doctor fax over the results.  Next appointment 05/31.

## 2014-11-04 NOTE — Progress Notes (Signed)
Urine drug screen for this encounter is consistent for having no prescribed medication since one week ago.

## 2014-11-17 ENCOUNTER — Encounter: Payer: Self-pay | Admitting: Registered Nurse

## 2014-11-17 ENCOUNTER — Encounter (HOSPITAL_BASED_OUTPATIENT_CLINIC_OR_DEPARTMENT_OTHER): Payer: Medicaid Other | Admitting: Registered Nurse

## 2014-11-17 VITALS — BP 119/88 | HR 85 | Resp 14

## 2014-11-17 DIAGNOSIS — G894 Chronic pain syndrome: Secondary | ICD-10-CM

## 2014-11-17 DIAGNOSIS — Z79899 Other long term (current) drug therapy: Secondary | ICD-10-CM

## 2014-11-17 DIAGNOSIS — G5641 Causalgia of right upper limb: Secondary | ICD-10-CM | POA: Diagnosis not present

## 2014-11-17 DIAGNOSIS — M79641 Pain in right hand: Secondary | ICD-10-CM | POA: Diagnosis not present

## 2014-11-17 DIAGNOSIS — G5601 Carpal tunnel syndrome, right upper limb: Secondary | ICD-10-CM

## 2014-11-17 DIAGNOSIS — M5440 Lumbago with sciatica, unspecified side: Secondary | ICD-10-CM

## 2014-11-17 DIAGNOSIS — Z5181 Encounter for therapeutic drug level monitoring: Secondary | ICD-10-CM

## 2014-11-17 MED ORDER — OXYCODONE-ACETAMINOPHEN 7.5-325 MG PO TABS: 1.0000 | ORAL_TABLET | Freq: Two times a day (BID) | ORAL | Status: DC | PRN

## 2014-11-17 NOTE — Progress Notes (Signed)
Subjective:    Patient ID: Brianna Lin, female    DOB: Sep 09, 1965, 49 y.o.   MRN: [id number]  HPI: [name] is a 49 year old female who returns for follow up appointment and medication refill. She says her pain is located in her neck, right hand,lower back and right knee. She rates her pain 10. Her current exercise regime is performing stretching exercises using the ball and walking.  Her UDS was consistent, the narcotic policy reviewed she verbalizes understanding. We will prescribed today.   Pain Inventory Average Pain 10 Pain Right Now 10 My pain is constant, sharp, burning, dull, stabbing, tingling and aching  In the last 24 hours, has pain interfered with the following? General activity 9 Relation with others 0 Enjoyment of life 3 What TIME of day is your pain at its worst? all Sleep (in general) varies  Pain is worse with: walking, bending and standing Pain improves with: rest and medication Relief from Meds: 0  Mobility walk without assistance how many minutes can you walk? 10 ability to climb steps?  no do you drive?  no Do you have any goals in this area?  yes  Function disabled: date disabled .  Neuro/Psych weakness numbness tingling trouble walking anxiety  Prior Studies Any changes since last visit?  no  Physicians involved in your care Any changes since last visit?  no   History reviewed. No pertinent family history. History   Social History  . Marital Status: Single    Spouse Name: N/A  . Number of Children: N/A  . Years of Education: N/A   Social History Main Topics  . Smoking status: Current Every Day Smoker -- 0.50 packs/day    Types: Cigarettes  . Smokeless tobacco: Not on file  . Alcohol Use: No  . Drug Use: No  . Sexual Activity: Yes    Birth Control/ Protection: Surgical   Other Topics Concern  . None   Social History Narrative   Past Surgical History  Procedure Laterality Date  . Cesarean section    .  Abdominal hysterectomy     Past Medical History  Diagnosis Date  . Diabetes mellitus without complication    BP 119/88 mmHg  Pulse 85  Resp 14  SpO2 99%  Opioid Risk Score:   Fall Risk Score: Low Fall Risk (0-5 points)`1  Depression screen PHQ 2/9  Depression screen PHQ 2/9 10/20/2014  Decreased Interest 3  Down, Depressed, Hopeless 1  PHQ - 2 Score 4  Altered sleeping 2  Tired, decreased energy 2  Change in appetite 0  Feeling bad or failure about yourself  1  Trouble concentrating 1  Moving slowly or fidgety/restless 1  Suicidal thoughts 0  PHQ-9 Score 11     Review of Systems  Neurological: Positive for weakness and numbness.       Tingling  Psychiatric/Behavioral: The patient is nervous/anxious.        Objective:   Physical Exam  Constitutional: She is oriented to person, place, and time. She appears well-developed and well-nourished.  HENT:  Head: Normocephalic and atraumatic.  Neck: Normal range of motion. Neck supple.  Cervical Paraspinal Tenderness: C-3- C-6  Cardiovascular: Normal rate and regular rhythm.   Pulmonary/Chest: Effort normal and breath sounds normal.  Musculoskeletal:  Normal Muscle Bulk and Muscle Testing Reveals: Upper Extremities: Full ROM and Muscle Strength on the Right 3/5 and Left 4/5 Spinal Forward Flexion 30 Degrees and Extension 20 Degree's with facial Grimacing Thoracic Paraspinal  Hypersensitivity: T-1- T-4 Lumbar Paraspinal Tenderness: L-3- L-5 Lower Extremities: Full ROM and Muscle Strength 5/5 Right Lower Extremity Flexion Produces Pain into Patella and toes Arises from chair with ease Narrow Based Gait    Neurological: She is alert and oriented to person, place, and time.  Skin: Skin is warm and dry.  Nursing note and vitals reviewed.         Assessment & Plan:  1. Complex Regional Pain Syndrome Right Upper Extremity: RX: Percocet 7.5 mg one tablet twice a day as needed for moderate to sever pain #60. Continue  Cymbalta 2. Carpal Tunnel: Continue to Monitor 3. Low Back Pain with Sciatica: Dr. Yevette Lin Following: Continue current medication regime. Encouraged to continue with HEP as tolerated.  20 minutes of face to face patient care time was spent during this visit. All questions were encouraged and answered.  F/U in 1 month

## 2014-11-27 ENCOUNTER — Telehealth: Payer: Self-pay | Admitting: Registered Nurse

## 2014-11-27 NOTE — Telephone Encounter (Signed)
Spoke with Brianna Lin regarding ger medications. She is under [employer] we will prescribed her Xanax when she needs a refilled. Her last prescription was picked up on 11/11/2014 according to [facility]. She verbalizes understanding.

## 2014-12-08 ENCOUNTER — Telehealth: Payer: Self-pay | Admitting: *Deleted

## 2014-12-08 MED ORDER — ALPRAZOLAM 1 MG PO TABS: 1.0000 mg | ORAL_TABLET | Freq: Three times a day (TID) | ORAL | Status: DC | PRN

## 2014-12-08 NOTE — Telephone Encounter (Signed)
After consulting with Riley Lam, we refilled her alprazolam 1 mg tid prn #90 1 RF.  I left message for her that it had been called in.

## 2014-12-08 NOTE — Telephone Encounter (Signed)
Pearl called because her alprazolam is due to be refilled 11/09/14 and she says we are going to assume prescribing because of her medicaid lock.  She would like this called to the [facility] in [place].

## 2014-12-14 ENCOUNTER — Encounter: Payer: Medicaid Other | Admitting: Registered Nurse

## 2014-12-15 ENCOUNTER — Encounter: Payer: Medicaid Other | Attending: Physical Medicine & Rehabilitation | Admitting: Registered Nurse

## 2014-12-15 ENCOUNTER — Encounter: Payer: Self-pay | Admitting: Registered Nurse

## 2014-12-15 ENCOUNTER — Ambulatory Visit: Payer: Medicaid Other | Admitting: Registered Nurse

## 2014-12-15 VITALS — BP 128/90 | HR 90 | Resp 14

## 2014-12-15 DIAGNOSIS — F17210 Nicotine dependence, cigarettes, uncomplicated: Secondary | ICD-10-CM | POA: Insufficient documentation

## 2014-12-15 DIAGNOSIS — G5601 Carpal tunnel syndrome, right upper limb: Secondary | ICD-10-CM | POA: Insufficient documentation

## 2014-12-15 DIAGNOSIS — M79674 Pain in right toe(s): Secondary | ICD-10-CM | POA: Diagnosis not present

## 2014-12-15 DIAGNOSIS — Z79899 Other long term (current) drug therapy: Secondary | ICD-10-CM | POA: Diagnosis not present

## 2014-12-15 DIAGNOSIS — E1165 Type 2 diabetes mellitus with hyperglycemia: Secondary | ICD-10-CM | POA: Insufficient documentation

## 2014-12-15 DIAGNOSIS — G5641 Causalgia of right upper limb: Secondary | ICD-10-CM

## 2014-12-15 DIAGNOSIS — Z5181 Encounter for therapeutic drug level monitoring: Secondary | ICD-10-CM

## 2014-12-15 DIAGNOSIS — E1140 Type 2 diabetes mellitus with diabetic neuropathy, unspecified: Secondary | ICD-10-CM | POA: Diagnosis not present

## 2014-12-15 DIAGNOSIS — M79641 Pain in right hand: Secondary | ICD-10-CM | POA: Insufficient documentation

## 2014-12-15 DIAGNOSIS — M25562 Pain in left knee: Secondary | ICD-10-CM

## 2014-12-15 DIAGNOSIS — M25561 Pain in right knee: Secondary | ICD-10-CM

## 2014-12-15 MED ORDER — OXYCODONE-ACETAMINOPHEN 7.5-325 MG PO TABS: 1.0000 | ORAL_TABLET | Freq: Two times a day (BID) | ORAL | Status: DC | PRN

## 2014-12-15 NOTE — Progress Notes (Signed)
Subjective:    Patient ID: Brianna Lin, female    DOB: 06/16/1966, 49 y.o.   MRN: [id number]  HPI: Brianna Lin is a 49 year old female who returns for follow up appointment and medication refill. She says her pain is located in her neck, right hand, lower back, bilateral knee's and right foot. Also complaining of increase intensity of pain in her neck and entire back hypersensitivity noted with palpation. We will increased her Percocet tablets today, [facility]She verbalizes understanding. She rates her pain 10. Her current exercise regime is performing stretching exercises and walking.  Also stated while she was visiting her daughter in [place] she was walking in her home and her right knee gave out. She tried to brace her fall by putting her bilateral hands out she landed on her hands. Her daughter assisted her up she didn't seek medical attention. Also states she is experiencing nausea, vomiting and diarrhea for a few days. She believes she caught the virus from her grand daughter. She had a small bout of bilious emesis today. She was offered ice chips, encouraged to follow up with her PCP she verbalizes understanding.  Pain Inventory Average Pain 10 Pain Right Now 10 My pain is constant, sharp, burning, stabbing, tingling and aching  In the last 24 hours, has pain interfered with the following? General activity 9 Relation with others 5 Enjoyment of life 3 What TIME of day is your pain at its worst? ALL Sleep (in general) Poor  Pain is worse with: walking, bending and standing Pain improves with: rest and medication Relief from Meds: 3  Mobility walk without assistance how many minutes can you walk? 10 ability to climb steps?  yes do you drive?  yes  Function disabled: date disabled .  Neuro/Psych weakness numbness tingling anxiety  Prior Studies Any changes since last visit?  no  Physicians involved in your  care Any changes since last visit?  no   History reviewed. No pertinent family history. History   Social History  . Marital Status: Single    Spouse Name: N/A  . Number of Children: N/A  . Years of Education: N/A   Social History Main Topics  . Smoking status: Current Every Day Smoker -- 0.50 packs/day    Types: Cigarettes  . Smokeless tobacco: Not on file  . Alcohol Use: No  . Drug Use: No  . Sexual Activity: Yes    Birth Control/ Protection: Surgical   Other Topics Concern  . None   Social History Narrative   Past Surgical History  Procedure Laterality Date  . Cesarean section    . Abdominal hysterectomy     Past Medical History  Diagnosis Date  . Diabetes mellitus without complication    Pulse 90  Resp 14  SpO2 100%  Opioid Risk Score:   Fall Risk Score: Moderate Fall Risk (6-13 points)`1  Depression screen PHQ 2/9  Depression screen PHQ 2/9 10/20/2014  Decreased Interest 3  Down, Depressed, Hopeless 1  PHQ - 2 Score 4  Altered sleeping 2  Tired, decreased energy 2  Change in appetite 0  Feeling bad or failure about yourself  1  Trouble concentrating 1  Moving slowly or fidgety/restless 1  Suicidal thoughts 0  PHQ-9 Score 11     Review of Systems  HENT: Negative.   Eyes: Negative.   Respiratory: Negative.   Cardiovascular: Negative.   Gastrointestinal: Positive for nausea and vomiting.  Endocrine: Negative.  Genitourinary: Negative.   Musculoskeletal: Positive for myalgias, back pain and arthralgias.  Skin: Negative.   Allergic/Immunologic: Negative.   Neurological: Positive for weakness and numbness.       Tingling  Hematological: Negative.   Psychiatric/Behavioral: The patient is nervous/anxious.        Objective:   Physical Exam  Constitutional: She is oriented to person, place, and time. She appears well-developed and well-nourished.  HENT:  Head: Normocephalic and atraumatic.  Neck: Normal range of motion. Neck supple.    Cervical Paraspinal Tenderness: C-3- C-6  Cardiovascular: Normal rate and regular rhythm.   Pulmonary/Chest: Effort normal and breath sounds normal.  Musculoskeletal:  Normal Muscle Bulk and Muscle Testing Reveals: Upper Extremities: Right: Decreased ROM 90 Degrees and Muscle Strength 3/5 Left: Full ROM and Muscle Strength 4/5 Thoracic and Lumbar Hypersensitivity Lower Extremities: Full ROM and Muscle Strength 5/5 Right Lower Extremity Flexion Produces Pain into Lower Extremity and foot. Left Lower extremity Flexion Produces pain into Patella Arises from chair with ease Narrow based gait    Neurological: She is alert and oriented to person, place, and time.  Skin: Skin is warm and dry.  Psychiatric: She has a normal mood and affect.  Nursing note and vitals reviewed.         Assessment & Plan:  1. Complex Regional Pain Syndrome Right Upper Extremity: RX: Percocet 7.5 mg one tablet twice a day as needed for moderate to sever pain.May Take an Extra tablet when Pain is sever. Increased to #70.  Continue Cymbalta 2. Carpal Tunnel: Continue to Monitor 3. Low Back Pain with Sciatica: Dr. Yevette Edwards Following: Continue current medication regime. Encouraged to continue with HEP as tolerated. Awaiting MRI Report [facility] 4. Bilateral Knee Pain Right Greater than Left: Awaiting MRI Report. 25 minutes of face to face patient care time was spent during this visit. All questions were encouraged and answered.  F/U in 1 month

## 2014-12-16 ENCOUNTER — Telehealth: Payer: Self-pay | Admitting: *Deleted

## 2014-12-16 NOTE — Telephone Encounter (Signed)
Pt called asking foir Brianna Lin to call her back...Marland Kitchen.no specific reason given

## 2014-12-17 ENCOUNTER — Telehealth: Payer: Self-pay | Admitting: Physical Medicine & Rehabilitation

## 2014-12-17 NOTE — Telephone Encounter (Signed)
Return Brianna Lin  Call : the pharmacy only dispense her 65 tablets. I explained the script was written correctly. She verbalizes understanding.

## 2014-12-28 NOTE — Telephone Encounter (Signed)
[facility]  I called her back but got voicemail.  Left message to return my call.

## 2014-12-31 NOTE — Telephone Encounter (Signed)
Attempted to return Ms. Elmquist call, no answer. Left message to return call.

## 2015-01-01 ENCOUNTER — Telehealth: Payer: Self-pay | Admitting: Registered Nurse

## 2015-01-01 NOTE — Telephone Encounter (Signed)
Brianna Lin called stating her pain has intensified in her lower back and neck. She was instructed to follow medication regime as prescribed. We are trying to obtain MR results she verbalizes understanding. All questions answered.

## 2015-01-04 ENCOUNTER — Telehealth: Payer: Self-pay | Admitting: Registered Nurse

## 2015-01-04 NOTE — Telephone Encounter (Signed)
Placed a call to [name], spoke with Dr. Riley KillSwartz regarding her MRI. He recommends Cervical Epidural Injection awaiting her call back to speak to her in regard of this treatment modality.

## 2015-01-04 NOTE — Telephone Encounter (Signed)
I spoke with Brianna Lin we discuss Cervical Epidural Injection. She states she doesn't want to have any cervical epidural injections, she was receiving cervical injections by Dr. Cherlyn LabellaBertrand's and started experiencing increase intensity of pain she states. She will call Dr. Yevette Edwardsumonski for a follow up appointment.

## 2015-01-12 ENCOUNTER — Encounter: Payer: Self-pay | Admitting: Registered Nurse

## 2015-01-12 ENCOUNTER — Encounter: Payer: Medicaid Other | Attending: Physical Medicine & Rehabilitation | Admitting: Registered Nurse

## 2015-01-12 VITALS — BP 126/86 | HR 82 | Resp 14

## 2015-01-12 DIAGNOSIS — E1140 Type 2 diabetes mellitus with diabetic neuropathy, unspecified: Secondary | ICD-10-CM | POA: Insufficient documentation

## 2015-01-12 DIAGNOSIS — G5641 Causalgia of right upper limb: Secondary | ICD-10-CM

## 2015-01-12 DIAGNOSIS — M79641 Pain in right hand: Secondary | ICD-10-CM | POA: Insufficient documentation

## 2015-01-12 DIAGNOSIS — E1165 Type 2 diabetes mellitus with hyperglycemia: Secondary | ICD-10-CM | POA: Insufficient documentation

## 2015-01-12 DIAGNOSIS — F17210 Nicotine dependence, cigarettes, uncomplicated: Secondary | ICD-10-CM | POA: Insufficient documentation

## 2015-01-12 DIAGNOSIS — G894 Chronic pain syndrome: Secondary | ICD-10-CM | POA: Diagnosis not present

## 2015-01-12 DIAGNOSIS — R278 Other lack of coordination: Secondary | ICD-10-CM

## 2015-01-12 DIAGNOSIS — Z5181 Encounter for therapeutic drug level monitoring: Secondary | ICD-10-CM

## 2015-01-12 DIAGNOSIS — M79674 Pain in right toe(s): Secondary | ICD-10-CM | POA: Diagnosis not present

## 2015-01-12 DIAGNOSIS — G5601 Carpal tunnel syndrome, right upper limb: Secondary | ICD-10-CM | POA: Insufficient documentation

## 2015-01-12 DIAGNOSIS — Z79899 Other long term (current) drug therapy: Secondary | ICD-10-CM | POA: Diagnosis not present

## 2015-01-12 DIAGNOSIS — R296 Repeated falls: Secondary | ICD-10-CM

## 2015-01-12 MED ORDER — OXYCODONE-ACETAMINOPHEN 7.5-325 MG PO TABS: 1.0000 | ORAL_TABLET | Freq: Two times a day (BID) | ORAL | Status: DC | PRN

## 2015-01-12 NOTE — Progress Notes (Signed)
Subjective:    Patient ID: Brianna Lin, female    DOB: Jun 23, 1965, 49 y.o.   MRN: [id number]  HPI: [name] is a 49 year old female who returns for follow up appointment and medication refill. She says her pain is located in her neck radiating into upper extremities,  lower back and bilateral feet. She rates her pain 10. Her current exercise regime is walking. States increase intensity of neuropathic pain into her upper and lower extremities. Also concerned about discoloration of left greater toe on yesterday, encouraged to follow up with her PCP she verbalizes understanding. [name] states she had eight cervical injections with no relief noted, she has a follow up appointment with Dr. Yevette Edwards on Friday 12/16/14. Also states she has gabapentin at home will call office regarding the dosage she has.  Also states she was walking outside with her care taker two weeks ago her right knee gave out and her caretaker was able to brace her which prevented her from falling. Educated on falls prevention. Will order a cane and physical therapy, she verbalizes understanding. Educated on Diabetes and follow up care such as opthalmology and podiatry appointments she verbalizes understanding.     Pain Inventory Average Pain 10 Pain Right Now 10 My pain is constant, sharp, burning, dull, stabbing, tingling and aching  In the last 24 hours, has pain interfered with the following? General activity 7 Relation with others 4 Enjoyment of life 7 What TIME of day is your pain at its worst? morning Sleep (in general) Poor  Pain is worse with: walking, bending, sitting, standing and some activites Pain improves with: rest and medication Relief from Meds: 3  Mobility walk without assistance how many minutes can you walk? 5 ability to climb steps?  no do you drive?  no  Function disabled: date disabled .  Neuro/Psych weakness numbness tingling trouble walking depression  Prior  Studies Any changes since last visit?  no  Physicians involved in your care Any changes since last visit?  no   History reviewed. No pertinent family history. History   Social History  . Marital Status: Single    Spouse Name: N/A  . Number of Children: N/A  . Years of Education: N/A   Social History Main Topics  . Smoking status: Current Every Day Smoker -- 0.50 packs/day    Types: Cigarettes  . Smokeless tobacco: Not on file  . Alcohol Use: No  . Drug Use: No  . Sexual Activity: Yes    Birth Control/ Protection: Surgical   Other Topics Concern  . None   Social History Narrative   Past Surgical History  Procedure Laterality Date  . Cesarean section    . Abdominal hysterectomy     Past Medical History  Diagnosis Date  . Diabetes mellitus without complication    Pulse 82  Resp 14  SpO2 96%  Opioid Risk Score:   Fall Risk Score:  `1  Depression screen PHQ 2/9  Depression screen [facility] 2/9 01/12/2015 10/20/2014  Decreased Interest 2 3  Down, Depressed, Hopeless 2 1  PHQ - 2 Score 4 4  Altered sleeping 3 2  Tired, decreased energy 2 2  Change in appetite 1 0  Feeling bad or failure about yourself  2 1  Trouble concentrating 1 1  Moving slowly or fidgety/restless 1 1  Suicidal thoughts 0 0  PHQ-9 Score 14 11  Difficult doing work/chores Not difficult at all -      Review  of Systems  HENT: Negative.   Eyes: Negative.   Respiratory: Negative.   Cardiovascular: Negative.   Gastrointestinal: Negative.   Endocrine: Negative.   Genitourinary: Negative.   Musculoskeletal: Positive for myalgias, back pain, arthralgias and neck pain.  Skin: Negative.   Allergic/Immunologic: Negative.   Neurological: Positive for weakness and numbness.       Trouble walking, tingling  Hematological: Negative.   Psychiatric/Behavioral: Positive for dysphoric mood.       Objective:   Physical Exam  Constitutional: She is oriented to person, place, and time. She appears  well-developed and well-nourished.  HENT:  Head: Normocephalic and atraumatic.  Neck: Normal range of motion. Neck supple.  Cervical Paraspinal Tenderness: C-5- C-6  Cardiovascular: Normal rate and regular rhythm.   Pulmonary/Chest: Breath sounds normal.  Musculoskeletal:  Normal Muscle Bulk and Muscle Testing Reveals: Upper Extremities: Full ROM and Muscle Strength 5/5 Lumbar Paraspinal Tenderness: L-3- L-5 Lower Extremities: Full ROM and Muscle Strength 5/5 Arises from chair with ease Narrow Based Gait  Neurological: She is alert and oriented to person, place, and time.  Skin: Skin is warm and dry.  Psychiatric: She has a normal mood and affect.  Nursing note and vitals reviewed.         Assessment & Plan:  1. Complex Regional Pain Syndrome Right Upper Extremity: RX: Percocet 7.5 mg one tablet twice a day as needed for moderate to sever pain.May Take an Extra tablet when Pain is sever #70. Continue Cymbalta 2. Carpal Tunnel: Continue to Monitor 3. Low Back Pain with Sciatica: Dr. Yevette Edwards Following: Continue current medication regime. Encouraged to continue with HEP as tolerated. 4. Bilateral Knee Pain: No Complaints Today.  25 minutes of face to face patient care time was spent during this visit. All questions were encouraged and answered.  F/U in 1 month

## 2015-01-22 ENCOUNTER — Ambulatory Visit: Payer: Medicaid Other | Admitting: Rehabilitative and Restorative Service Providers"

## 2015-01-29 ENCOUNTER — Telehealth: Payer: Self-pay | Admitting: Physical Medicine & Rehabilitation

## 2015-01-29 MED ORDER — ALPRAZOLAM 1 MG PO TABS: 1.0000 mg | ORAL_TABLET | Freq: Three times a day (TID) | ORAL | Status: AC | PRN

## 2015-01-29 NOTE — Telephone Encounter (Signed)
I spoke with Ms Brianna Lin and asked if she was out of her medication.  She said she has 6 pills left.  I told her the prescription is 30 days and she last filled 01/04/15 and she is not to take more than 3 per day. This refill cannot be filled before 02/02/15. I also asked that she bring her bottle to each appointment with her oxycodone.  Riley Lam had asked that she call back about her gabapentin and she has not called so I asked her about her dose.  She says it wasn't working before and it isn't working now.  She could not tell me the dose.  I told her to please be prepared to discuss this with Riley Lam at her 02/10/15 appt.

## 2015-01-29 NOTE — Telephone Encounter (Signed)
Patient requesting a refill on Xanax.  Patient now has a new Engineer, site on [address] in [place].  Any questions please call patient.

## 2015-02-04 ENCOUNTER — Ambulatory Visit: Payer: Medicaid Other | Attending: Registered Nurse | Admitting: Physical Therapy

## 2015-02-10 ENCOUNTER — Other Ambulatory Visit: Payer: Self-pay | Admitting: Registered Nurse

## 2015-02-10 ENCOUNTER — Encounter: Payer: Self-pay | Admitting: Registered Nurse

## 2015-02-10 ENCOUNTER — Encounter: Payer: Medicaid Other | Attending: Physical Medicine & Rehabilitation | Admitting: Registered Nurse

## 2015-02-10 VITALS — BP 130/99 | HR 66 | Resp 14

## 2015-02-10 DIAGNOSIS — G5601 Carpal tunnel syndrome, right upper limb: Secondary | ICD-10-CM | POA: Insufficient documentation

## 2015-02-10 DIAGNOSIS — M5440 Lumbago with sciatica, unspecified side: Secondary | ICD-10-CM | POA: Diagnosis not present

## 2015-02-10 DIAGNOSIS — F17210 Nicotine dependence, cigarettes, uncomplicated: Secondary | ICD-10-CM | POA: Diagnosis not present

## 2015-02-10 DIAGNOSIS — G5641 Causalgia of right upper limb: Secondary | ICD-10-CM | POA: Diagnosis not present

## 2015-02-10 DIAGNOSIS — E1140 Type 2 diabetes mellitus with diabetic neuropathy, unspecified: Secondary | ICD-10-CM | POA: Insufficient documentation

## 2015-02-10 DIAGNOSIS — M79674 Pain in right toe(s): Secondary | ICD-10-CM | POA: Diagnosis not present

## 2015-02-10 DIAGNOSIS — M79641 Pain in right hand: Secondary | ICD-10-CM | POA: Insufficient documentation

## 2015-02-10 DIAGNOSIS — M25561 Pain in right knee: Secondary | ICD-10-CM | POA: Diagnosis not present

## 2015-02-10 DIAGNOSIS — E1165 Type 2 diabetes mellitus with hyperglycemia: Secondary | ICD-10-CM | POA: Insufficient documentation

## 2015-02-10 DIAGNOSIS — Z79899 Other long term (current) drug therapy: Secondary | ICD-10-CM

## 2015-02-10 DIAGNOSIS — G894 Chronic pain syndrome: Secondary | ICD-10-CM

## 2015-02-10 DIAGNOSIS — Z5181 Encounter for therapeutic drug level monitoring: Secondary | ICD-10-CM

## 2015-02-10 MED ORDER — OXYCODONE-ACETAMINOPHEN 7.5-325 MG PO TABS: 1.0000 | ORAL_TABLET | Freq: Two times a day (BID) | ORAL | Status: AC | PRN

## 2015-02-10 NOTE — Progress Notes (Signed)
Subjective:    Patient ID: Brianna Lin, female    DOB: September 20, 1965, 49 y.o.   MRN: [id number]  HPI: [name] is a 49 year old female who returns for follow up appointment and medication refill. She says her pain is located in her neck, right hand, lower back radiating into her buttock and bilateral hips and right knee. She rates her pain 10. Her current exercise regime is walking and performing stretching exercises.  [name] forgot her medication bottle states she was returning from [place] reviewed oxycodone was was picked up on 01/12/15. Also educated on the narcotic policy and reminded if she doesn't bring in her medication bottle no prescription will be issued on next visit, until she returns with pill bottle she verbalizes understanding. She verbalizes understanding. UDS obtained today.   Pain Inventory Average Pain 10 Pain Right Now 10 My pain is sharp, burning, dull, stabbing, tingling and aching  In the last 24 hours, has pain interfered with the following? General activity 10 Relation with others 7 Enjoyment of life 10 What TIME of day is your pain at its worst? all Sleep (in general) Fair  Pain is worse with: walking, bending, sitting and standing Pain improves with: heat/ice and medication Relief from Meds: 10  Mobility walk with assistance use a cane ability to climb steps?  no do you drive?  no Do you have any goals in this area?  yes  Function Do you have any goals in this area?  yes  Neuro/Psych weakness numbness tingling trouble walking anxiety  Prior Studies Any changes since last visit?  no  Physicians involved in your care Any changes since last visit?  no   Family History  Problem Relation Age of Onset  . Hypertension Mother   . Diabetes Father   . Hypertension Father   . Diabetes Brother   . Heart disease Brother   . COPD Brother    Social History   Social History  . Marital Status: Single    Spouse  Name: Brianna Lin  . Number of Children: Brianna Lin  . Years of Education: Brianna Lin   Social History Main Topics  . Smoking status: Current Every Day Smoker -- 0.50 packs/day    Types: Cigarettes  . Smokeless tobacco: None  . Alcohol Use: No  . Drug Use: No  . Sexual Activity: Yes    Birth Control/ Protection: Surgical   Other Topics Concern  . None   Social History Narrative   Past Surgical History  Procedure Laterality Date  . Cesarean section    . Abdominal hysterectomy     Past Medical History  Diagnosis Date  . Diabetes mellitus without complication    BP 130/99 mmHg  Pulse 66  Resp 14  SpO2 98%  Opioid Risk Score:   Fall Risk Score:  `1  Depression screen PHQ 2/9  Depression screen [facility] 2/9 01/12/2015 10/20/2014  Decreased Interest 2 3  Down, Depressed, Hopeless 2 1  PHQ - 2 Score 4 4  Altered sleeping 3 2  Tired, decreased energy 2 2  Change in appetite 1 0  Feeling bad or failure about yourself  2 1  Trouble concentrating 1 1  Moving slowly or fidgety/restless 1 1  Suicidal thoughts 0 0  PHQ-9 Score 14 11  Difficult doing work/chores Not difficult at all -     Review of Systems  Constitutional: Positive for diaphoresis.  Musculoskeletal: Positive for gait problem.  Neurological: Positive for weakness  and numbness.       Tingling   Psychiatric/Behavioral: The patient is nervous/anxious.   All other systems reviewed and are negative.      Objective:   Physical Exam  Constitutional: She is oriented to person, place, and time. She appears well-developed and well-nourished.  HENT:  Head: Normocephalic and atraumatic.  Neck: Normal range of motion. Neck supple.  Cervical Paraspinal Tenderness: C-5- C-6  Cardiovascular: Normal rate and regular rhythm.   Pulmonary/Chest: Effort normal and breath sounds normal.  Musculoskeletal:  Normal Muscle Bulk and Muscle Testing Reveals: Upper Extremities: Full ROM and Muscle Strength 5/5 Lumbar Paraspinal Tenderness: L-3-  L-5 Sacral Tenderness Lower Extremities: Full ROM and Muscle Strength 5/5 Right Lower Extremity Flexion Produces Pain into Patella and Lower Extremity Arises from chair with ease Narrow Based gait   Neurological: She is alert and oriented to person, place, and time.  Skin: Skin is warm and dry.  Psychiatric: She has a normal mood and affect.  Nursing note and vitals reviewed.         Assessment & Plan:  1. Complex Regional Pain Syndrome Right Upper Extremity: RX: Percocet 7.5 mg one tablet twice a day as needed for moderate to sever pain.May Take an Extra tablet when Pain is sever #70. Continue Cymbalta 2. Carpal Tunnel: Continue to Monitor 3. Low Back Pain with Sciatica: Dr. Yevette Edwards Following: Continue current medication regime. Encouraged to continue with HEP as tolerated. 4. Right Knee Pain: No Complaints Today.  20 minutes of face to face patient care time was spent during this visit. All questions were encouraged and answered.  F/U in 1 month

## 2015-02-10 NOTE — Addendum Note (Signed)
Addended by: Angela Nevin D on: 02/10/2015 11:11 AM   Modules accepted: Orders

## 2015-02-11 LAB — PMP ALCOHOL METABOLITE (ETG)

## 2015-02-12 LAB — PRESCRIPTION MONITORING PROFILE (SOLSTAS)

## 2015-02-15 ENCOUNTER — Telehealth: Payer: Self-pay | Admitting: *Deleted

## 2015-02-15 NOTE — Telephone Encounter (Signed)
Brianna Lin called to make an appointment.  I informed her that she is being discharged because her urine sample was water and she will receive her letter in the mail with copy of surrounding pain clinics her PCP can make referrals to if she chooses.

## 2015-02-24 ENCOUNTER — Ambulatory Visit: Payer: Medicaid Other | Admitting: Physical Therapy

## 2015-03-01 ENCOUNTER — Ambulatory Visit: Payer: Medicaid Other | Attending: Physical Therapy | Admitting: Physical Therapy

## 2015-07-19 ENCOUNTER — Ambulatory Visit (HOSPITAL_COMMUNITY): Payer: Medicaid Other | Admitting: Psychiatry

## 2015-07-24 DIAGNOSIS — F419 Anxiety disorder, unspecified: Secondary | ICD-10-CM | POA: Insufficient documentation

## 2015-07-24 DIAGNOSIS — E119 Type 2 diabetes mellitus without complications: Secondary | ICD-10-CM | POA: Insufficient documentation

## 2015-07-24 DIAGNOSIS — M2550 Pain in unspecified joint: Secondary | ICD-10-CM | POA: Insufficient documentation

## 2015-07-24 DIAGNOSIS — I10 Essential (primary) hypertension: Secondary | ICD-10-CM | POA: Insufficient documentation

## 2015-08-11 ENCOUNTER — Encounter (HOSPITAL_COMMUNITY): Payer: Self-pay | Admitting: Psychiatry

## 2015-08-11 ENCOUNTER — Ambulatory Visit (HOSPITAL_COMMUNITY): Payer: Medicaid Other | Admitting: Psychiatry

## 2015-08-11 ENCOUNTER — Encounter (HOSPITAL_COMMUNITY): Payer: Medicaid Other | Admitting: Psychiatry

## 2015-08-11 NOTE — Progress Notes (Signed)
This encounter was created in error - please disregard.

## 2016-01-28 ENCOUNTER — Other Ambulatory Visit: Payer: Self-pay | Admitting: Primary Care

## 2016-02-01 ENCOUNTER — Other Ambulatory Visit: Payer: Self-pay | Admitting: Primary Care

## 2016-02-01 DIAGNOSIS — M2550 Pain in unspecified joint: Secondary | ICD-10-CM

## 2016-02-01 DIAGNOSIS — Z90710 Acquired absence of both cervix and uterus: Secondary | ICD-10-CM

## 2016-02-09 ENCOUNTER — Other Ambulatory Visit: Payer: Self-pay | Admitting: Primary Care

## 2016-02-09 DIAGNOSIS — M2550 Pain in unspecified joint: Secondary | ICD-10-CM

## 2016-02-09 DIAGNOSIS — E8940 Asymptomatic postprocedural ovarian failure: Secondary | ICD-10-CM

## 2016-02-09 DIAGNOSIS — Z90710 Acquired absence of both cervix and uterus: Secondary | ICD-10-CM

## 2016-02-15 ENCOUNTER — Other Ambulatory Visit: Payer: Self-pay | Admitting: Primary Care

## 2016-02-15 DIAGNOSIS — Z780 Asymptomatic menopausal state: Secondary | ICD-10-CM

## 2016-02-22 ENCOUNTER — Other Ambulatory Visit: Payer: Self-pay

## 2016-02-29 ENCOUNTER — Inpatient Hospital Stay: Admission: RE | Admit: 2016-02-29 | Payer: Self-pay | Source: Ambulatory Visit

## 2016-03-03 ENCOUNTER — Other Ambulatory Visit: Payer: Self-pay

## 2016-03-09 ENCOUNTER — Other Ambulatory Visit: Payer: Self-pay

## 2016-03-13 ENCOUNTER — Inpatient Hospital Stay: Admission: RE | Admit: 2016-03-13 | Payer: Self-pay | Source: Ambulatory Visit

## 2016-03-17 ENCOUNTER — Other Ambulatory Visit: Payer: Self-pay

## 2016-03-23 ENCOUNTER — Other Ambulatory Visit: Payer: Self-pay

## 2016-03-24 ENCOUNTER — Other Ambulatory Visit: Payer: Self-pay

## 2016-03-27 ENCOUNTER — Inpatient Hospital Stay: Admission: RE | Admit: 2016-03-27 | Payer: Self-pay | Source: Ambulatory Visit

## 2016-03-31 ENCOUNTER — Other Ambulatory Visit: Payer: Self-pay

## 2016-04-13 ENCOUNTER — Other Ambulatory Visit: Payer: Self-pay

## 2016-04-20 ENCOUNTER — Ambulatory Visit
Admission: RE | Admit: 2016-04-20 | Discharge: 2016-04-20 | Disposition: A | Discharge status: Home / Self Care | Payer: Medicaid Other | Source: Ambulatory Visit | Attending: Primary Care | Admitting: Primary Care

## 2016-04-20 DIAGNOSIS — Z780 Asymptomatic menopausal state: Secondary | ICD-10-CM
